# Patient Record
Sex: Female | Born: 1995 | Race: Black or African American | Hispanic: No | Marital: Single | Smoking: Never smoker
Health system: Southern US, Community
[De-identification: ages and names within clinical notes are randomized; demographics above are authoritative.]

---
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
## 2016-11-01 DIAGNOSIS — H1013 Acute atopic conjunctivitis, bilateral: Secondary | ICD-10-CM | POA: Diagnosis not present

## 2016-11-01 DIAGNOSIS — H40033 Anatomical narrow angle, bilateral: Secondary | ICD-10-CM | POA: Diagnosis not present

## 2018-04-08 DIAGNOSIS — S838X2A Sprain of other specified parts of left knee, initial encounter: Secondary | ICD-10-CM | POA: Diagnosis not present

## 2018-05-14 DIAGNOSIS — J029 Acute pharyngitis, unspecified: Secondary | ICD-10-CM | POA: Diagnosis not present

## 2018-05-14 DIAGNOSIS — J069 Acute upper respiratory infection, unspecified: Secondary | ICD-10-CM | POA: Diagnosis not present

## 2018-08-06 ENCOUNTER — Encounter (HOSPITAL_COMMUNITY): Payer: Self-pay | Admitting: Psychiatry

## 2018-08-06 ENCOUNTER — Ambulatory Visit (HOSPITAL_COMMUNITY): Payer: BLUE CROSS/BLUE SHIELD | Admitting: Psychiatry

## 2018-08-06 DIAGNOSIS — F4323 Adjustment disorder with mixed anxiety and depressed mood: Secondary | ICD-10-CM
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
## 2018-08-15 ENCOUNTER — Ambulatory Visit (INDEPENDENT_AMBULATORY_CARE_PROVIDER_SITE_OTHER): Payer: BLUE CROSS/BLUE SHIELD | Admitting: Psychiatry

## 2018-08-15 ENCOUNTER — Encounter (HOSPITAL_COMMUNITY): Payer: Self-pay | Admitting: Psychiatry

## 2018-08-15 DIAGNOSIS — Z202 Contact with and (suspected) exposure to infections with a predominantly sexual mode of transmission: Secondary | ICD-10-CM | POA: Diagnosis not present

## 2018-08-15 DIAGNOSIS — F4323 Adjustment disorder with mixed anxiety and depressed mood: Secondary | ICD-10-CM | POA: Diagnosis not present

## 2018-08-15 NOTE — Progress Notes (Signed)
Client: Silas Flood  Date: 08/15/18  Time: 10:01-10:55 am Type of Therapy: Individual Therapy  Axis I/II Diagnosis: Adjustment Disorder with mixed anxiety and depressed mood  Treatment goals addressed: [name] would like to use therapy time to heal from her past, be able to forgive herself and better understand her anxiety and depression.  Interventions: CBT, Motivational Interviewing, Psychoeducation, Coping Skill Building  Summary: Client [place], 22yo female who presents with Adjustment Disorder, due to recent break up and stage of life issues. Counselor using therapeutic interventions to address grief, loss, triggers and anger and to prepare [name] to focus on self-care, higher education and upcoming move.  Therapist Response: [name] met with Counselor for individual therapy. Counselor joined with D[place] Ar as she shared about relationship dynamics, weekend situation, expression of anger, and future plans. Counselor assessed current psychiatric symptoms and life stressors with [name]. [name] reported that she has felt her anxiety and depression less over the past week. She has not punched any holes in walls and has been mindful about expressing herself with words, versus aggressive physical actions. She reported her ex and her got in one verbally heated argument, but she was able to restrain from physical aggression or property damage. Counselor praised [name] for her self-awareness and self-control and we explored how she achieved this change in behaviors. Counselor shared psychoeducation on grief and loss and setting boundaries. [name] identified that this is an area where she struggles but has made attempts. Counselor gave [name] homework of writing down her wants and needs in a relationship. Counselor and [name] discussed healthy communication and coping skills to prevent future "blow ups" with her ex. Counselor encouraged self-care and [name] was able to identify several ways she could focus on her well-being between now and next  session.  Suicidal/Homicidal: No current safety concerns. No plan/intent to harm self or others.  Plan: To return in 1 week. Will apply skills learned in session at home, until next session.  ?  Lise Auer, LCSW

## 2018-08-22 ENCOUNTER — Ambulatory Visit (INDEPENDENT_AMBULATORY_CARE_PROVIDER_SITE_OTHER): Payer: BLUE CROSS/BLUE SHIELD | Admitting: Psychiatry

## 2018-08-22 ENCOUNTER — Encounter (HOSPITAL_COMMUNITY): Payer: Self-pay | Admitting: Psychiatry

## 2018-08-22 DIAGNOSIS — F4323 Adjustment disorder with mixed anxiety and depressed mood: Secondary | ICD-10-CM | POA: Diagnosis not present

## 2018-08-22 NOTE — Progress Notes (Signed)
Client: Silas Flood  Date: 08/22/18  Time: 9:34-10:31  Type of Therapy: Individual Therapy  Axis I/II Diagnosis: Adjustment Disorder with mixed anxiety and depressed mood  Treatment goals addressed: [name] would like to use therapy time to heal from her past, be able to forgive herself and better understand her anxiety and depression.  Interventions: CBT, Motivational Interviewing, Psychoeducation, Coping Skill Building  Summary: Client [place], 22yo female who presents with Adjustment Disorder, due to recent break up and stage of life issues. Counselor using therapeutic interventions to address grief, loss, triggers and anger and to prepare [name] to focus on self-care, higher education and upcoming move.  Therapist Response: [name] met with Counselor for individual therapy. Counselor joined with Denton Ar as she shared about her recent trip with friends, status of break up, living arrangements, school stressors and grief/loss issues. Counselor assessed current psychiatric symptoms and life stressors with [name]. [name] report no angry outbursts over the past week, despite having emotional triggers. She reports having frequent moments of sadness thinking about memories with her ex. Counselor processed implementation of healthy coping skills, development of healthy support system, and new routine building. Counselor praised [name] for advocating for her needs, expressing her emotions in healthy ways, and communicating appropriately with friends and family. Counselor provided psychoeducation on manifestations of anxiety, positive affirmations, and deep breathing exercises. [name] practices coping skills and shared ways she felt she could implement them over the next week.  Suicidal/Homicidal: No current safety concerns. No plan/intent to harm self or others.  Plan: To return in 1 week. Will apply skills learned in session at home, until next session.  ?  Lise Auer, LCSW

## 2018-08-29 ENCOUNTER — Ambulatory Visit (INDEPENDENT_AMBULATORY_CARE_PROVIDER_SITE_OTHER): Payer: BLUE CROSS/BLUE SHIELD | Admitting: Psychiatry

## 2018-08-29 DIAGNOSIS — F4323 Adjustment disorder with mixed anxiety and depressed mood: Secondary | ICD-10-CM | POA: Diagnosis not present

## 2018-08-31 ENCOUNTER — Encounter (HOSPITAL_COMMUNITY): Payer: Self-pay | Admitting: Psychiatry

## 2018-08-31 NOTE — Progress Notes (Signed)
Client: Jody Edwards  Date: 08/29/18  Time: 9:37-10:33  Type of Therapy: Individual Therapy  Axis I/II Diagnosis: Adjustment Disorder with mixed anxiety and depressed mood  Treatment goals addressed: Jody Edwards would like to use therapy time to heal from her past, be able to forgive herself and better understand her anxiety and depression.  Interventions: CBT, Motivational Interviewing, Psychoeducation, Coping Skill Building  Summary: Client Jody Edwards, 22yo female who presents with Adjustment Disorder, due to recent break up and stage of life issues. Counselor using therapeutic interventions to address grief, loss, triggers and anger and to prepare Jody Edwards to focus on self-care, higher education and upcoming move.  Therapist Response: Jody Edwards met with Counselor for individual therapy. Counselor joined with Jody Edwards as she shared that she is grieving the loss of an aunt, is communicating effectively with her ex, has made improvements in school performance and utilization of coping skills. Counselor assessed current psychiatric symptoms and life stressors with Jody Edwards. Jody Edwards reported having an "up and down" week, somedays were great, some days she felt lonely, anxious and cried over things. Counselor assessed which coping skills she used to handle difficult moments. She reported that box breathing, and positive affirmations helped to deescalate and reduce anxiety. Counselor provided psychoeducation on grief and loss cycle, as Jody Edwards shared about suppressing feelings about her aunt's passing in January. Jody Edwards was able to express her sadness in session. Counselor and Jody Edwards processed family history of relationship issues and Counselor gave her homework to talk with a family member about their intimate partner relationships.  Suicidal/Homicidal: No current safety concerns. No plan/intent to harm self or others.  Plan: To return in 1 week. Will apply skills learned in session at home, until next session.  ?  Lise Auer, LCSW

## 2018-09-04 ENCOUNTER — Other Ambulatory Visit: Payer: Self-pay | Admitting: Obstetrics and Gynecology

## 2018-09-04 ENCOUNTER — Other Ambulatory Visit (HOSPITAL_COMMUNITY)
Admission: RE | Admit: 2018-09-04 | Discharge: 2018-09-04 | Disposition: A | Discharge status: Home / Self Care | Payer: BLUE CROSS/BLUE SHIELD | Source: Ambulatory Visit | Attending: Obstetrics and Gynecology | Admitting: Obstetrics and Gynecology

## 2018-09-04 DIAGNOSIS — Z01419 Encounter for gynecological examination (general) (routine) without abnormal findings: Secondary | ICD-10-CM | POA: Insufficient documentation

## 2018-09-05 ENCOUNTER — Ambulatory Visit (HOSPITAL_COMMUNITY): Payer: BLUE CROSS/BLUE SHIELD | Admitting: Psychiatry

## 2018-09-10 LAB — CYTOLOGY - PAP
Chlamydia: NEGATIVE
DIAGNOSIS: UNDETERMINED — AB
HPV: NOT DETECTED
Neisseria Gonorrhea: NEGATIVE

## 2018-09-12 ENCOUNTER — Ambulatory Visit (HOSPITAL_COMMUNITY): Payer: BLUE CROSS/BLUE SHIELD | Admitting: Psychiatry

## 2018-09-12 ENCOUNTER — Other Ambulatory Visit: Payer: Self-pay

## 2018-09-12 ENCOUNTER — Ambulatory Visit (INDEPENDENT_AMBULATORY_CARE_PROVIDER_SITE_OTHER): Payer: BLUE CROSS/BLUE SHIELD | Admitting: Psychiatry

## 2018-09-12 DIAGNOSIS — F4323 Adjustment disorder with mixed anxiety and depressed mood: Secondary | ICD-10-CM

## 2018-09-15 ENCOUNTER — Encounter (HOSPITAL_COMMUNITY): Payer: Self-pay | Admitting: Psychiatry

## 2018-09-15 NOTE — Progress Notes (Signed)
Client: Jody Edwards  Date: 09/12/18  Time: 10:30-11:25 a  Type of Therapy: Individual Therapy  Axis I/II Diagnosis: Adjustment Disorder with mixed anxiety and depressed mood  Treatment goals addressed: Jody Edwards would like to use therapy time to heal from her past, be able to forgive herself and better understand her anxiety and depression.  Interventions: CBT, Motivational Interviewing, Psychoeducation, Coping Skill Building  Summary: Client Jody Edwards, 22yo female who presents with Adjustment Disorder, due to recent break up and stage of life issues. Counselor using therapeutic interventions to address grief, loss, triggers and anger and to prepare Jody Edwards to focus on self-care, higher education and upcoming move.  Therapist Response: Jody Edwards met with Counselor for individual therapy. Counselor joined with Jody Edwards as she Shared about recent travels, sickness, relationship dynamics, emotional reactivity and progress in all areas. Counselor assessed current psychiatric symptoms and life stressors with Jody Edwards. Jody Edwards reported that she has been more teary, and experiencing effects of grief and loss-mainly sadness and some anger. Counselor introduced the idea of getting a medication evaluation to determine if medications would be helpful short term for coping. Jody Edwards is interested and will look into making an appointment. Counselor assessed current relationships with Jody Edwards to discuss ways to better manage anger and sadness. Counselor provided feedback about the cycle of toxic and unhealthy relationships and how it impacts our mood, behaviors and sociability. Jody Edwards was open and frank about her role in relationships not working out and expressed trust issues, which stem from her relationship with her father. Counselor praised Jody Edwards for her insights and encouraged identification of values and needs within a future relationship. Jody Edwards will set aside time to think about these matters over the next week and will attempt to spend more time  alone to process thoughts and feelings.  Suicidal/Homicidal: No current safety concerns. No plan/intent to harm self or others.  Plan: To return in 1 week. Will apply skills learned in session at home, until next session.  ?  Lise Auer, LCSW

## 2018-09-18 ENCOUNTER — Other Ambulatory Visit: Payer: Self-pay

## 2018-09-18 ENCOUNTER — Encounter (HOSPITAL_COMMUNITY): Payer: Self-pay | Admitting: Psychiatry

## 2018-09-18 ENCOUNTER — Ambulatory Visit (HOSPITAL_COMMUNITY): Payer: BLUE CROSS/BLUE SHIELD | Admitting: Psychiatry

## 2018-09-18 VITALS — BP 120/82 | HR 65 | Temp 98.4°F | Resp 16 | Wt 162.6 lb

## 2018-09-18 DIAGNOSIS — F4323 Adjustment disorder with mixed anxiety and depressed mood: Secondary | ICD-10-CM | POA: Insufficient documentation

## 2018-09-18 MED ORDER — ESCITALOPRAM OXALATE 10 MG PO TABS
10.0000 mg | ORAL_TABLET | Freq: Every day | ORAL | 0 refills | Status: DC
Start: 2018-09-18 — End: 2018-10-15

## 2018-09-18 NOTE — Progress Notes (Signed)
Psychiatric Initial Adult Assessment   Patient Identification: Jody Edwards MRN:  [id number] Date of Evaluation:  09/18/2018 Referral Source: Elsie Saas MD Chief Complaint:  Periods of depression Visit Diagnosis:    ICD-10-CM   1. 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 These mood changes have been originally brought on by breakup in 2 year long relationship with female partner. They broke up on New Year and what makes the situation even more difficult is that they still are roommates (have been living together for 7 months). Later in Jul 25, 2022 patient's aunt passed away and this contributed further to her depression. Patient started individual counseling with Hilbert Odor in early February and has had 5 sessions so far. Her sleep improved and is normal now, anxiety subsided, appetite is unchanged. She has never felt hopeless or suicidal. What worries her and eventually triggered this visit is that she continues to have frequent bouts of "feeling depressed" and would like to try an antidepressant at this time. She has never needed or taken any psychiatric medications. She denies having hx of mania, psychosis, alcohol problem drinking or drug abuse.  Patient is single, no children, admittedly bisexual. 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  Associated Signs/Symptoms: Depression Symptoms:  depressed mood, (Hypo) Manic Symptoms:  Irritable Mood, Anxiety Symptoms:  None Psychotic Symptoms:  None PTSD Symptoms: Negative  Past Psychiatric History: see above  Previous Psychotropic Medications: No   Substance Abuse History in the last 12 months:  No.  Consequences of Substance Abuse: Negative  Past Medical History: No past medical history on file. No past surgical history  on file.  Family Psychiatric History: Does not know of any.  Family History: No family history on file.  Social History:   Social History   Socioeconomic History  . Marital status: Single    Spouse name: Not on file  . Number of children: Not on file  . Years of education: Not on file  . Highest education level: Not on file  Occupational History  . Not on file  Social Needs  . Financial resource strain: Not on file  . Food insecurity:    Worry: Not on file    Inability: Not on file  . Transportation needs:    Medical: Not on file    Non-medical: Not on file  Tobacco Use  . Smoking status: Never Smoker  . Smokeless tobacco: Never Used  Substance and Sexual Activity  . Alcohol use: Yes    Alcohol/week: 3.0 standard drinks    Types: 3 Glasses of wine per week  . Drug use: Never  . Sexual activity: Not Currently  Lifestyle  . Physical activity:    Days per week: Not on file    Minutes per session: Not on file  . Stress: Not on file  Relationships  . Social connections:    Talks on phone: Not on file    Gets together: Not on file    Attends religious service: Not on file    Active member of club or organization: Not on file    Attends meetings of clubs or organizations: Not on file    Relationship status: Not on file  Other Topics Concern  . Not on file  Social History Narrative  . Not on  file     Allergies:  No Known Allergies  Metabolic Disorder Labs: No results found for: HGBA1C, MPG No results found for: PROLACTIN No results found for: CHOL, TRIG, HDL, CHOLHDL, VLDL, LDLCALC No results found for: TSH  Therapeutic Level Labs: No results found for: LITHIUM No results found for: CBMZ No results found for: VALPROATE  Current Medications: Current Outpatient Medications  Medication Sig Dispense Refill  . escitalopram (LEXAPRO) 10 MG tablet Take 1 tablet (10 mg total) by mouth daily for 30 days. 30 tablet 0   No current facility-administered medications  for this visit.     Musculoskeletal: Strength & Muscle Tone: within normal limits Gait & Station: normal Patient leans: N/A  Psychiatric Specialty Exam: Review of Systems  Constitutional: Negative.   HENT: Negative.   Eyes: Negative.   Respiratory: Negative.   Cardiovascular: Negative.   Gastrointestinal: Negative.   Genitourinary: Negative.   Musculoskeletal: Negative.   Skin: Negative.   Neurological: Negative.   Endo/Heme/Allergies: Negative.   Psychiatric/Behavioral: Positive for depression.    Blood pressure 120/82, pulse 65, temperature 98.4 F (36.9 C), resp. rate 16, weight 162 lb 9.6 oz (73.8 kg).There is no height or weight on file to calculate BMI.  General Appearance: Casual  Eye Contact:  Good  Speech:  Clear and Coherent  Volume:  Normal  Mood:  Depressed  Affect:  Full Range  Thought Process:  Goal Directed  Orientation:  Full (Time, Place, and Person)  Thought Content:  Logical  Suicidal Thoughts:  Denies  Homicidal Thoughts:  No  Memory:  Immediate;   Good Recent;   Good Remote;   Good  Judgement:  Intact  Insight:  Fair  Psychomotor Activity:  Normal  Concentration:  Concentration: Good  Recall:  Good  Fund of Knowledge:Good  Language: Good  Akathisia:  Negative  Handed:  Right  AIMS (if indicated):  not done  Assets:  Communication Skills Desire for Improvement Housing Physical Health Vocational/Educational  ADL's:  Intact  Cognition: WNL  Sleep:  Good    Assessment and Plan: 23 yo single female with no psychiatric hx presents with some depression which has been present for the third month and does not seem to be improving mkuch. Anxiety and irritability subsided.ite is good. Her mood symptoms started after breakup of 2 year lomng relationship and intensified (depression ) after loss of aunt f soon afterwords. She is counseling with Hilbert Odor (5 sessions so far) and is making progress there. She is interested in a trial af an  ntidepressant despite relatively mild symptoms. I have explained what she expect from a trial of an SSRI revieweing both potential benefits as well as adverse effects. We decided to try a low-medium dose of escitalopram. The only other medciation she takes is naprosyn for menstrual cramps.   Dx: Adjustment disorder with mixed emotions  Plan: Escitalopram 5 mg daily x 6 days then 10 mg daily. Continue individual counseling as per previous plan. Return to clinic in one month. We anticipate that she will come off escitalopram within next 6 months providing there are no new stressors present at that time. The plan was discussed with patient. I spend 55 minutes in direct face to face clinical contact with the patient and devoted approximately 50% of this time to explanation of diagnosis, discussion of treatment options and med education.   Magdalene Patricia, MD 3/19/202010:44 AM

## 2018-09-19 ENCOUNTER — Ambulatory Visit (HOSPITAL_COMMUNITY): Payer: BLUE CROSS/BLUE SHIELD | Admitting: Psychiatry

## 2018-09-26 ENCOUNTER — Ambulatory Visit (HOSPITAL_COMMUNITY): Payer: BLUE CROSS/BLUE SHIELD | Admitting: Psychiatry

## 2018-10-03 ENCOUNTER — Ambulatory Visit (INDEPENDENT_AMBULATORY_CARE_PROVIDER_SITE_OTHER): Payer: BLUE CROSS/BLUE SHIELD | Admitting: Psychiatry

## 2018-10-03 ENCOUNTER — Encounter (HOSPITAL_COMMUNITY): Payer: Self-pay | Admitting: Psychiatry

## 2018-10-03 ENCOUNTER — Other Ambulatory Visit: Payer: Self-pay

## 2018-10-03 DIAGNOSIS — F4323 Adjustment disorder with mixed anxiety and depressed mood: Secondary | ICD-10-CM | POA: Diagnosis not present

## 2018-10-03 NOTE — Progress Notes (Signed)
Virtual Visit via Video Note  I connected with [name] on 10/03/18 at 11:30 AM EDT by a video enabled telemedicine application and verified that I am speaking with the correct person using two identifiers.   I discussed the limitations of evaluation and management by telemedicine and the availability of in person appointments. The patient expressed understanding and agreed to proceed.  History of Present Illness: Adjustment disorder with mixed anxiety and depressed mood due to recent break up and stage of life issues.    Observations/Objective: Counselor met with [name] via Webex for individual therapy. Counselor checked in to assess psychiatric symptoms and life stressors influenced by COVID-19 restrictions. [name] presented as very calm, happy, optimistic and centered. [name] reported on several positive aspects of her progress on treatment goals. Counselor praised her for utilizing healthy coping skills, setting boundaries and communicating her needs with others. [name] reported how she is implementing coping strategies and decreasing her own stress levels. Counselor provided psychoeducation on parent child relationships through the life cycle related to her concerns about her relationship with her father. Counselor reiterated continuing self-care routine and staying safe.   Assessment and Plan: [name] would like to move to meeting EOW due to progress made in therapy.  Follow Up Instructions: Counselor will send link for next session.   I discussed the assessment and treatment plan with the patient. The patient was provided an opportunity to ask questions and all were answered. The patient agreed with the plan and demonstrated an understanding of the instructions.   The patient was advised to call back or seek an in-person evaluation if the symptoms worsen or if the condition fails to improve as anticipated.  I provided 54 minutes of non-face-to-face time during this encounter.   Lise Auer,  LCSW

## 2018-10-15 ENCOUNTER — Telehealth (HOSPITAL_COMMUNITY): Payer: Self-pay

## 2018-10-15 ENCOUNTER — Other Ambulatory Visit (HOSPITAL_COMMUNITY): Payer: Self-pay | Admitting: Psychiatry

## 2018-10-15 MED ORDER — ESCITALOPRAM OXALATE 10 MG PO TABS: 10.0000 mg | ORAL_TABLET | Freq: Every day | ORAL | 0 refills | Status: AC

## 2018-10-15 NOTE — Telephone Encounter (Signed)
I did send  A 30 day refill this time but she really needs to make a follow up appointment if she wants to keep getting refills from now on.  OP

## 2018-10-15 NOTE — Telephone Encounter (Signed)
Medication refill request - Fax refill request for patient's started Escitalopram.  Patient started on medication 09/18/18 with plan to return in 1 month but never made another MD appt.

## 2018-10-16 NOTE — Telephone Encounter (Signed)
Medication management - Message left for patient Dr. Hinton Dyer had sent in a new 30 day order for her Lexapro but that she would need to call back to set up an appt within the next 30 days for any further refills. Requested patient call our office back as soon as possible to schedule a virtual video or phone visit within the next 30 days with Dr. Hinton Dyer.

## 2018-10-17 ENCOUNTER — Other Ambulatory Visit: Payer: Self-pay

## 2018-10-17 ENCOUNTER — Ambulatory Visit (HOSPITAL_COMMUNITY): Payer: BLUE CROSS/BLUE SHIELD | Admitting: Psychiatry

## 2018-10-23 ENCOUNTER — Encounter (HOSPITAL_COMMUNITY): Payer: Self-pay | Admitting: Psychiatry

## 2018-10-23 ENCOUNTER — Other Ambulatory Visit: Payer: Self-pay

## 2018-10-23 ENCOUNTER — Ambulatory Visit (INDEPENDENT_AMBULATORY_CARE_PROVIDER_SITE_OTHER): Payer: BLUE CROSS/BLUE SHIELD | Admitting: Psychiatry

## 2018-10-23 DIAGNOSIS — F4323 Adjustment disorder with mixed anxiety and depressed mood: Secondary | ICD-10-CM

## 2018-10-23 NOTE — Progress Notes (Signed)
Virtual Visit via Video Note  I connected with Lorel A Heng on 10/23/18 at 10:00 AM EDT by a video enabled telemedicine application and verified that I am speaking with the correct person using two identifiers.   I discussed the limitations of evaluation and management by telemedicine and the availability of in person appointments. The patient expressed understanding and agreed to proceed.  History of Present Illness: Adjustment disorder with mixed anxiety and depressed mood due to stage of life issues, relational issues and grief and loss.    Observations/Objective: Counselor met with [name] via Webex for individual therapy. Counselor assessed mental health symptoms. [name] reports that overall her mood has stabilized and that more than anything she is experiencing boredom which has caused her to drink alcohol more. Counselor explored motivation for drinking and promoted healthier coping skills. Counselor processed thoughts and feelings regarding loss of job, ending school semester and moving apartments. [name] is choosing to be proactive and has a plan for addressing those stressors. [name] reports making healthy choices in her personal relationships and improved communication and anger management skills. Counselor praised her for her progress in these areas. Counselor and [name] reviewed plan and healthy coping skills and wrapped up the session.   Assessment and Plan: Counselor and [name] will continue to meet to address treatment plan goals. [name] will complete tasks identified in session to reduce her stress levels.    Follow Up Instructions: Counselor will set up additional appointments and will send Webex link for next session.    I discussed the assessment and treatment plan with the patient. The patient was provided an opportunity to ask questions and all were answered. The patient agreed with the plan and demonstrated an understanding of the instructions.   The patient was advised to call back or seek  an in-person evaluation if the symptoms worsen or if the condition fails to improve as anticipated.  I provided 43 minutes of non-face-to-face time during this encounter.   Bethany Morris, LCSW  

## 2018-10-27 DIAGNOSIS — Z0000 Encounter for general adult medical examination without abnormal findings: Secondary | ICD-10-CM | POA: Diagnosis not present

## 2018-10-31 ENCOUNTER — Ambulatory Visit (HOSPITAL_COMMUNITY): Payer: BLUE CROSS/BLUE SHIELD | Admitting: Psychiatry

## 2018-11-06 ENCOUNTER — Other Ambulatory Visit: Payer: Self-pay

## 2018-11-06 ENCOUNTER — Ambulatory Visit (INDEPENDENT_AMBULATORY_CARE_PROVIDER_SITE_OTHER): Payer: BLUE CROSS/BLUE SHIELD | Admitting: Psychiatry

## 2018-11-06 DIAGNOSIS — F4323 Adjustment disorder with mixed anxiety and depressed mood: Secondary | ICD-10-CM

## 2018-11-07 ENCOUNTER — Encounter (HOSPITAL_COMMUNITY): Payer: Self-pay | Admitting: Psychiatry

## 2018-11-07 NOTE — Progress Notes (Signed)
Virtual Visit via Video Note  I connected with Jody Edwards on 11/07/18 at  2:30 PM EDT by a video enabled telemedicine application and verified that I am speaking with the correct person using two identifiers.  Location: Patient: Jody Edwards  Provider: Lise Auer, LCSW   I discussed the limitations of evaluation and management by telemedicine and the availability of in person appointments. The patient expressed understanding and agreed to proceed.  History of Present Illness: Adjustment disorder with mixed anxiety and depressed mood due to stage of life issues, financial and housing concerns.    Observations/Objective: Counselor met with Jody Edwards for individual therapy via Webex. Counselor assessed MH symptoms and progress on treatment plan goals. Jody Edwards shared that she has been experiencing more anxiety and depression because she is currently unemployed, not receiving income, her housing will be ending on June 1st, she  and she is having relationship issues. Counselor used CBT interventions to help her process these stressors. We reviewed helpful coping skills to address the issues. Jody Edwards reported that she's been drinking more frequently to cope. Counselor assessed usage and impact. Counselor praised Jody Edwards for the progress she has made and how successful this semester has been for her overall. Counselor and Jody Edwards discussed future goals and upcoming projects. Jody Edwards denied suicidal ideation or self-harm.  Assessment and Plan: Counselor will continue to meet with Jody Edwards to address treatment plan goals. Jody Edwards will continue to follow recommendations of providers and implement skills learned in session.  Follow Up Instructions: Counselor will send information for next session via Webex.   I discussed the assessment and treatment plan with the patient. The patient was provided an opportunity to ask questions and all were answered. The patient agreed with the plan and demonstrated an understanding of the  instructions.   The patient was advised to call back or seek an in-person evaluation if the symptoms worsen or if the condition fails to improve as anticipated.  I provided 55 minutes of non-face-to-face time during this encounter.   Lise Auer, LCSW

## 2018-11-20 ENCOUNTER — Ambulatory Visit (HOSPITAL_COMMUNITY): Payer: BLUE CROSS/BLUE SHIELD | Admitting: Psychiatry

## 2018-11-20 ENCOUNTER — Other Ambulatory Visit: Payer: Self-pay

## 2018-12-04 ENCOUNTER — Other Ambulatory Visit: Payer: Self-pay

## 2018-12-04 ENCOUNTER — Ambulatory Visit (HOSPITAL_COMMUNITY): Payer: BLUE CROSS/BLUE SHIELD | Admitting: Psychiatry

## 2018-12-04 ENCOUNTER — Ambulatory Visit (INDEPENDENT_AMBULATORY_CARE_PROVIDER_SITE_OTHER): Payer: BC Managed Care – PPO | Admitting: Psychiatry

## 2018-12-04 DIAGNOSIS — F4323 Adjustment disorder with mixed anxiety and depressed mood: Secondary | ICD-10-CM

## 2018-12-05 ENCOUNTER — Encounter (HOSPITAL_COMMUNITY): Payer: Self-pay | Admitting: Psychiatry

## 2018-12-05 NOTE — Progress Notes (Signed)
Virtual Visit via Video Note  I connected with Jody Edwards on 12/05/18 at  2:00 PM EDT by a video enabled telemedicine application and verified that I am speaking with the correct person using two identifiers.  Location: Patient: Jody Edwards Provider: Lise Auer, LCSW   I discussed the limitations of evaluation and management by telemedicine and the availability of in person appointments. The patient expressed understanding and agreed to proceed.  History of Present Illness: Adjustment Disorder with mixed anxiety and depressed mood due to stage of life issues, unemployment and grief and loss issues.    Observations/Objective: Counselor met with Jody Edwards for individual therapy via Webex. Counselor assessed MH symptoms and progress on treatment plan goals. Jody Edwards denied suicidal ideation or self-harm behaviors. Jody Edwards shared that she was experiencing increased depression and anxiety. Jody Edwards shared that she has been making attempts to share her feelings and concerns within her support system, but doesn't feel heard or understood, which causes her to act out in anger and frustration. Counselor explored these feelings and interactions within her relationships. Counselor reviewed healthy coping skills and communication skills. Counselor used CBT interventions to help Jody Edwards processed thoughts, emotions and actions to better understand how they are all related. Counselor used Solution-Focused interventions to problem solve areas of concern for Jody Edwards. Counselor prompted Jody Edwards to shared positive aspects of life, statements of gratitude and positive mantras she can use to override the unhelpful automatic thoughts. Jody Edwards responded well to therapy and thanked Counselor for support and space to work on these issues.    Assessment and Plan: Counselor will continue to meet with Jody Edwards to address treatment plan goals. Jody Edwards will continue to follow recommendations of providers and implement  skills learned in session.  Follow Up Instructions: Counselor will send information for next session via Webex.     I discussed the assessment and treatment plan with the patient. The patient was provided an opportunity to ask questions and all were answered. The patient agreed with the plan and demonstrated an understanding of the instructions.   The patient was advised to call back or seek an in-person evaluation if the symptoms worsen or if the condition fails to improve as anticipated.  I provided 55 minutes of non-face-to-face time during this encounter.   Lise Auer, LCSW

## 2018-12-18 ENCOUNTER — Ambulatory Visit (INDEPENDENT_AMBULATORY_CARE_PROVIDER_SITE_OTHER): Payer: BC Managed Care – PPO | Admitting: Psychiatry

## 2018-12-18 ENCOUNTER — Other Ambulatory Visit: Payer: Self-pay

## 2018-12-18 DIAGNOSIS — F4323 Adjustment disorder with mixed anxiety and depressed mood: Secondary | ICD-10-CM | POA: Diagnosis not present

## 2018-12-19 ENCOUNTER — Encounter (HOSPITAL_COMMUNITY): Payer: Self-pay | Admitting: Psychiatry

## 2018-12-19 NOTE — Progress Notes (Signed)
Virtual Visit via Video Note  I connected with Jody Edwards on 12/19/18 at  3:00 PM EDT by a video enabled telemedicine application and verified that I am speaking with the correct person using two identifiers.  Location: Patient: Jody Edwards Provider: Lise Auer, LCSW   I discussed the limitations of evaluation and management by telemedicine and the availability of in person appointments. The patient expressed understanding and agreed to proceed.  History of Present Illness:    Observations/Objective: Counselor met with Jody Edwards for individual therapy via Webex. Counselor assessed MH symptoms and progress on treatment plan goals. Jody Edwards denied suicidal ideation or self-harm behaviors. Jody Edwards shared that that she recently experienced her first panic attack in over 3 years, due to a conflict between her and her mother. Her partner and mom were able to help her recover and stop the panic attack. It took her 2 days to feel normal again. Counselor processed emotions and shared DBT TIPP Skills for recovering from Panic attacks. We addressed the life stressors that have been impacting her mental health. Counselor prompted Jody Edwards to use CBT skills in processing thoughts and feelings in relation. Counselor and Jody Edwards discussed progress in other areas and reviewed communication skills to use with people within her support system.   Assessment and Plan: Counselor will continue to meet with Jody Edwards to address treatment plan goals. Jody Edwards will continue to follow recommendations of providers and implement skills learned in session.  Follow Up Instructions: Counselor will send information for next session via Webex.    I discussed the assessment and treatment plan with the patient. The patient was provided an opportunity to ask questions and all were answered. The patient agreed with the plan and demonstrated an understanding of the instructions.   The patient was advised to call back or seek an in-person evaluation if  the symptoms worsen or if the condition fails to improve as anticipated.  I provided 55 minutes of non-face-to-face time during this encounter.   Lise Auer, LCSW

## 2019-01-08 ENCOUNTER — Encounter (HOSPITAL_COMMUNITY): Payer: Self-pay | Admitting: Psychiatry

## 2019-01-08 ENCOUNTER — Other Ambulatory Visit: Payer: Self-pay

## 2019-01-08 ENCOUNTER — Ambulatory Visit (INDEPENDENT_AMBULATORY_CARE_PROVIDER_SITE_OTHER): Payer: BC Managed Care – PPO | Admitting: Psychiatry

## 2019-01-08 DIAGNOSIS — F4323 Adjustment disorder with mixed anxiety and depressed mood: Secondary | ICD-10-CM

## 2019-01-08 NOTE — Progress Notes (Signed)
Virtual Visit via Video Note  I connected with Jody Edwards on 01/08/19 at  4:00 PM EDT by a video enabled telemedicine application and verified that I am speaking with the correct person using two identifiers.  Location: Patient: Jody Edwards Provider: Lise Auer, LCSW   I discussed the limitations of evaluation and management by telemedicine and the availability of in person appointments. The patient expressed understanding and agreed to proceed.  History of Present Illness: Adjustment Disorder with mixed conduct and mood   Observations/Objective: Counselor met with Jody Edwards for individual therapy via Webex. Counselor assessed MH symptoms and progress on treatment plan goals. Jody Edwards denied suicidal ideation or self-harm behaviors. Jody Edwards shared that her symptoms have improved with less frequent episodes of moderate depression and anxiety. She reports having no panic attacks since May. Counselor processed coping skills as she transitions in housing, income, relationships and with family dynamics. Counselor praised Jody Edwards for her application of coping skills, cognitive coping and communicating her needs with her support system. Counselor discussed changes in frequency of session. Counselor processed safety and well-being in her returning to school for preventative measures. Jody Edwards reports feeling much better overall.   Assessment and Plan: Counselor will continue to meet with Jody Edwards to address treatment plan goals. Jody Edwards will continue to follow recommendations of providers and implement skills learned in session.  Follow Up Instructions: Counselor will send information for next session via Webex.     I discussed the assessment and treatment plan with the patient. The patient was provided an opportunity to ask questions and all were answered. The patient agreed with the plan and demonstrated an understanding of the instructions.   The patient was advised to call back or seek an in-person evaluation if the  symptoms worsen or if the condition fails to improve as anticipated.  I provided 50 minutes of non-face-to-face time during this encounter.   Lise Auer, LCSW

## 2019-02-17 ENCOUNTER — Other Ambulatory Visit: Payer: Self-pay

## 2019-02-17 ENCOUNTER — Ambulatory Visit (INDEPENDENT_AMBULATORY_CARE_PROVIDER_SITE_OTHER): Payer: BC Managed Care – PPO | Admitting: Psychiatry

## 2019-02-17 ENCOUNTER — Encounter (HOSPITAL_COMMUNITY): Payer: Self-pay | Admitting: Psychiatry

## 2019-02-17 DIAGNOSIS — F4323 Adjustment disorder with mixed anxiety and depressed mood: Secondary | ICD-10-CM | POA: Diagnosis not present

## 2019-02-17 NOTE — Progress Notes (Signed)
Virtual Visit via Video Note  I connected with Jody Edwards on 02/17/19 at  3:00 PM EDT by a video enabled telemedicine application and verified that I am speaking with the correct person using two identifiers.  Location: Patient: Jody Edwards  Provider: Bethany Morris, LCSW   I discussed the limitations of evaluation and management by telemedicine and the availability of in person appointments. The patient expressed understanding and agreed to proceed.  History of Present Illness: Adjustment DO with mixed anxiety and depressed mood   Observations/Objective: Counselor met with Jody Edwards for individual therapy via Webex. Counselor assessed MH symptoms and progress on treatment plan goals. Jody Edwards denied suicidal ideation or self-harm behaviors. Jody Edwards shared that over the past month she has been on an "emotional roller coaster". Counselor explored life stressors and circumstances around her emotional turmoil. Counselor used CBT interventions to process thoughts and emotions. Jody Edwards was able to identify how she is using healthy coping and communication skills to address conflicts in her personal life and relationships. Jody Edwards reported making major life decisions over the past month and feeling good about her decisions. Counselor celebrated her successes and reviewed past coping strategies in addressing her problems.   Assessment and Plan: Counselor will continue to meet with patient to address treatment plan goals. Patient will continue to follow recommendations of providers and implement skills learned in session.  Follow Up Instructions: Counselor will send information for next session via Webex.     I discussed the assessment and treatment plan with the patient. The patient was provided an opportunity to ask questions and all were answered. The patient agreed with the plan and demonstrated an understanding of the instructions.   The patient was advised to call back or seek an in-person evaluation if  the symptoms worsen or if the condition fails to improve as anticipated.  I provided 55 minutes of non-face-to-face time during this encounter.   Bethany Morris, LCSW  

## 2019-04-06 ENCOUNTER — Ambulatory Visit (HOSPITAL_COMMUNITY): Payer: BC Managed Care – PPO | Admitting: Psychiatry

## 2019-04-06 ENCOUNTER — Other Ambulatory Visit: Payer: Self-pay

## 2019-04-16 ENCOUNTER — Ambulatory Visit (HOSPITAL_COMMUNITY): Payer: BC Managed Care – PPO | Admitting: Psychiatry

## 2019-05-06 DIAGNOSIS — J029 Acute pharyngitis, unspecified: Secondary | ICD-10-CM | POA: Diagnosis not present

## 2019-05-06 DIAGNOSIS — Z20828 Contact with and (suspected) exposure to other viral communicable diseases: Secondary | ICD-10-CM | POA: Diagnosis not present

## 2019-05-06 DIAGNOSIS — J0100 Acute maxillary sinusitis, unspecified: Secondary | ICD-10-CM | POA: Diagnosis not present

## 2019-05-06 DIAGNOSIS — J3489 Other specified disorders of nose and nasal sinuses: Secondary | ICD-10-CM | POA: Diagnosis not present

## 2019-06-04 DIAGNOSIS — F411 Generalized anxiety disorder: Secondary | ICD-10-CM | POA: Diagnosis not present

## 2019-06-04 DIAGNOSIS — R87619 Unspecified abnormal cytological findings in specimens from cervix uteri: Secondary | ICD-10-CM | POA: Diagnosis not present

## 2019-06-04 DIAGNOSIS — L700 Acne vulgaris: Secondary | ICD-10-CM | POA: Diagnosis not present

## 2019-06-04 DIAGNOSIS — N946 Dysmenorrhea, unspecified: Secondary | ICD-10-CM | POA: Diagnosis not present

## 2019-06-16 DIAGNOSIS — U071 COVID-19: Secondary | ICD-10-CM | POA: Diagnosis not present

## 2019-06-16 DIAGNOSIS — Z20828 Contact with and (suspected) exposure to other viral communicable diseases: Secondary | ICD-10-CM | POA: Diagnosis not present

## 2019-08-01 ENCOUNTER — Emergency Department (HOSPITAL_COMMUNITY)
Admission: EM | Admit: 2019-08-01 | Discharge: 2019-08-01 | Disposition: A | Discharge status: Home / Self Care | Payer: No Typology Code available for payment source | Attending: Emergency Medicine | Admitting: Emergency Medicine

## 2019-08-01 ENCOUNTER — Emergency Department (HOSPITAL_COMMUNITY): Payer: No Typology Code available for payment source

## 2019-08-01 ENCOUNTER — Other Ambulatory Visit: Payer: Self-pay

## 2019-08-01 ENCOUNTER — Encounter (HOSPITAL_COMMUNITY): Payer: Self-pay | Admitting: *Deleted

## 2019-08-01 DIAGNOSIS — Y999 Unspecified external cause status: Secondary | ICD-10-CM | POA: Diagnosis not present

## 2019-08-01 DIAGNOSIS — Y92410 Unspecified street and highway as the place of occurrence of the external cause: Secondary | ICD-10-CM | POA: Insufficient documentation

## 2019-08-01 DIAGNOSIS — S3992XA Unspecified injury of lower back, initial encounter: Secondary | ICD-10-CM | POA: Diagnosis present

## 2019-08-01 DIAGNOSIS — M542 Cervicalgia: Secondary | ICD-10-CM | POA: Diagnosis not present

## 2019-08-01 DIAGNOSIS — M436 Torticollis: Secondary | ICD-10-CM

## 2019-08-01 DIAGNOSIS — S39012A Strain of muscle, fascia and tendon of lower back, initial encounter: Secondary | ICD-10-CM | POA: Insufficient documentation

## 2019-08-01 DIAGNOSIS — Y939 Activity, unspecified: Secondary | ICD-10-CM | POA: Insufficient documentation

## 2019-08-01 DIAGNOSIS — R519 Headache, unspecified: Secondary | ICD-10-CM | POA: Diagnosis not present

## 2019-08-01 LAB — POC URINE PREG, ED: Preg Test, Ur: NEGATIVE
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
## 2019-08-01 MED ORDER — METHOCARBAMOL 500 MG PO TABS: 500.0000 mg | ORAL_TABLET | Freq: Three times a day (TID) | ORAL | 0 refills | Status: AC | PRN

## 2019-08-01 NOTE — ED Notes (Signed)
Pt d/c home per MD order. Discharge summary reviewed, pt verbalizes understanding. Pt ambulatory off unit. No s/s of acute distress noted.
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
## 2019-08-01 NOTE — ED Notes (Signed)
Patient transported to X-ray 

## 2019-08-01 NOTE — Discharge Instructions (Addendum)
Please take Naproxen (aleve) and Tylenol (acetaminophen) to relieve your pain.  You may take two pills of aleve every 12 hours.  After 2-3 days please try to only take one pill every 12 hours.  In between doses of aleve you may take tylenol, up to 1,000 mg (two extra strength pills).  Do not take more than 3,000 mg tylenol in a 24 hour period.  Please check all medication labels as many medications such as pain and cold medications may contain tylenol.  Do not drink alcohol while taking these medications.  Do not take other NSAID'S while taking aleve (naproxen) (such as motrin, ibuprofen, voltaren or advil).  Please take aleve (naproxen) with food to decrease stomach upset.  If the aleve irritates your stomach you may stop and get voltaren gel.  This is over the counter and follow directions on the box.   You are being prescribed a medication which may make you sleepy. For 24 hours after one dose please do not drive, operate heavy machinery, care for a small child with out another adult present, or perform any activities that may cause harm to you or someone else if you were to fall asleep or be impaired.   

## 2019-09-07 DIAGNOSIS — Z20822 Contact with and (suspected) exposure to covid-19: Secondary | ICD-10-CM | POA: Diagnosis not present

## 2019-09-07 DIAGNOSIS — R197 Diarrhea, unspecified: Secondary | ICD-10-CM | POA: Diagnosis not present

## 2019-09-07 DIAGNOSIS — R110 Nausea: Secondary | ICD-10-CM | POA: Diagnosis not present

## 2020-01-02 ENCOUNTER — Inpatient Hospital Stay (HOSPITAL_COMMUNITY)
Admission: EM | Admit: 2020-01-02 | Discharge: 2020-01-05 | DRG: 208 | Disposition: A | Discharge status: Home / Self Care | Payer: BLUE CROSS/BLUE SHIELD | Attending: General Surgery | Admitting: General Surgery

## 2020-01-02 ENCOUNTER — Inpatient Hospital Stay (HOSPITAL_COMMUNITY): Payer: BLUE CROSS/BLUE SHIELD

## 2020-01-02 ENCOUNTER — Emergency Department (HOSPITAL_COMMUNITY): Payer: BLUE CROSS/BLUE SHIELD

## 2020-01-02 ENCOUNTER — Encounter (HOSPITAL_COMMUNITY): Payer: Self-pay | Admitting: Emergency Medicine

## 2020-01-02 DIAGNOSIS — R404 Transient alteration of awareness: Secondary | ICD-10-CM | POA: Diagnosis not present

## 2020-01-02 DIAGNOSIS — S90511A Abrasion, right ankle, initial encounter: Secondary | ICD-10-CM | POA: Diagnosis present

## 2020-01-02 DIAGNOSIS — S2242XA Multiple fractures of ribs, left side, initial encounter for closed fracture: Secondary | ICD-10-CM | POA: Diagnosis not present

## 2020-01-02 DIAGNOSIS — S129XXA Fracture of neck, unspecified, initial encounter: Secondary | ICD-10-CM | POA: Diagnosis present

## 2020-01-02 DIAGNOSIS — F449 Dissociative and conversion disorder, unspecified: Secondary | ICD-10-CM | POA: Diagnosis not present

## 2020-01-02 DIAGNOSIS — S22020A Wedge compression fracture of second thoracic vertebra, initial encounter for closed fracture: Secondary | ICD-10-CM | POA: Diagnosis not present

## 2020-01-02 DIAGNOSIS — S60512A Abrasion of left hand, initial encounter: Secondary | ICD-10-CM | POA: Diagnosis not present

## 2020-01-02 DIAGNOSIS — S022XXA Fracture of nasal bones, initial encounter for closed fracture: Secondary | ICD-10-CM | POA: Diagnosis present

## 2020-01-02 DIAGNOSIS — S32028A Other fracture of second lumbar vertebra, initial encounter for closed fracture: Secondary | ICD-10-CM | POA: Diagnosis not present

## 2020-01-02 DIAGNOSIS — S32038A Other fracture of third lumbar vertebra, initial encounter for closed fracture: Secondary | ICD-10-CM | POA: Diagnosis not present

## 2020-01-02 DIAGNOSIS — S270XXA Traumatic pneumothorax, initial encounter: Secondary | ICD-10-CM | POA: Diagnosis not present

## 2020-01-02 DIAGNOSIS — S32048A Other fracture of fourth lumbar vertebra, initial encounter for closed fracture: Secondary | ICD-10-CM | POA: Diagnosis present

## 2020-01-02 DIAGNOSIS — S12491A Other nondisplaced fracture of fifth cervical vertebra, initial encounter for closed fracture: Secondary | ICD-10-CM | POA: Diagnosis present

## 2020-01-02 DIAGNOSIS — S0081XA Abrasion of other part of head, initial encounter: Secondary | ICD-10-CM | POA: Diagnosis not present

## 2020-01-02 DIAGNOSIS — R200 Anesthesia of skin: Secondary | ICD-10-CM | POA: Diagnosis present

## 2020-01-02 DIAGNOSIS — T1490XA Injury, unspecified, initial encounter: Secondary | ICD-10-CM

## 2020-01-02 DIAGNOSIS — S025XXA Fracture of tooth (traumatic), initial encounter for closed fracture: Secondary | ICD-10-CM | POA: Diagnosis present

## 2020-01-02 DIAGNOSIS — Z20822 Contact with and (suspected) exposure to covid-19: Secondary | ICD-10-CM | POA: Diagnosis not present

## 2020-01-02 DIAGNOSIS — J9601 Acute respiratory failure with hypoxia: Secondary | ICD-10-CM | POA: Diagnosis not present

## 2020-01-02 DIAGNOSIS — S22058A Other fracture of T5-T6 vertebra, initial encounter for closed fracture: Secondary | ICD-10-CM | POA: Diagnosis present

## 2020-01-02 DIAGNOSIS — R531 Weakness: Secondary | ICD-10-CM | POA: Diagnosis present

## 2020-01-02 DIAGNOSIS — R402431 Glasgow coma scale score 3-8, in the field [EMT or ambulance]: Secondary | ICD-10-CM | POA: Diagnosis not present

## 2020-01-02 DIAGNOSIS — Z23 Encounter for immunization: Secondary | ICD-10-CM

## 2020-01-02 DIAGNOSIS — E876 Hypokalemia: Secondary | ICD-10-CM

## 2020-01-02 DIAGNOSIS — R0689 Other abnormalities of breathing: Secondary | ICD-10-CM | POA: Diagnosis not present

## 2020-01-02 DIAGNOSIS — J939 Pneumothorax, unspecified: Secondary | ICD-10-CM

## 2020-01-02 DIAGNOSIS — R092 Respiratory arrest: Secondary | ICD-10-CM

## 2020-01-02 LAB — COMPREHENSIVE METABOLIC PANEL
ALT: 75 U/L — ABNORMAL HIGH (ref 0–44)
AST: 122 U/L — ABNORMAL HIGH (ref 15–41)
Albumin: 3.9 g/dL (ref 3.5–5.0)
Alkaline Phosphatase: 49 U/L (ref 38–126)
Anion gap: 15 (ref 5–15)
BUN: 11 mg/dL (ref 6–20)
CO2: 16 mmol/L — ABNORMAL LOW (ref 22–32)
Calcium: 9 mg/dL (ref 8.9–10.3)
Chloride: 110 mmol/L (ref 98–111)
Creatinine, Ser: 1.04 mg/dL — ABNORMAL HIGH (ref 0.44–1.00)
GFR calc Af Amer: 37 mL/min — ABNORMAL LOW (ref >60)
GFR calc non Af Amer: 32 mL/min — ABNORMAL LOW (ref >60)
Glucose, Bld: 112 mg/dL — ABNORMAL HIGH (ref 70–99)
Potassium: 3.1 mmol/L — ABNORMAL LOW (ref 3.5–5.1)
Sodium: 141 mmol/L (ref 135–145)
Total Bilirubin: 0.6 mg/dL (ref 0.3–1.2)
Total Protein: 6.7 g/dL (ref 6.5–8.1)

## 2020-01-02 LAB — POCT I-STAT 7, (LYTES, BLD GAS, ICA,H+H)
Acid-base deficit: 7 mmol/L — ABNORMAL HIGH (ref 0.0–2.0)
Bicarbonate: 20.8 mmol/L (ref 20.0–28.0)
Calcium, Ion: 1.12 mmol/L — ABNORMAL LOW (ref 1.15–1.40)
HCT: 38 % (ref 36.0–46.0)
Hemoglobin: 12.9 g/dL (ref 12.0–15.0)
O2 Saturation: 100 %
Patient temperature: 98
Potassium: 3.8 mmol/L (ref 3.5–5.1)
Sodium: 141 mmol/L (ref 135–145)
TCO2: 22 mmol/L (ref 22–32)
pCO2 arterial: 50 mmHg — ABNORMAL HIGH (ref 32.0–48.0)
pH, Arterial: 7.225 — ABNORMAL LOW (ref 7.350–7.450)
pO2, Arterial: 532 mmHg — ABNORMAL HIGH (ref 83.0–108.0)

## 2020-01-02 LAB — BPAM FFP
Blood Product Expiration Date: 202107082359
Blood Product Expiration Date: 202107082359
Blood Product Expiration Date: 202107162359
Blood Product Expiration Date: 202107202359
ISSUE DATE / TIME: 202107030307
ISSUE DATE / TIME: 202107030307
ISSUE DATE / TIME: 202107030413
ISSUE DATE / TIME: 202107030413
Unit Type and Rh: 6200
Unit Type and Rh: 6200
Unit Type and Rh: 6200
Unit Type and Rh: 6200

## 2020-01-02 LAB — CBC
HCT: 42 % (ref 36.0–46.0)
HCT: 38.1 % (ref 36.0–46.0)
Hemoglobin: 12.8 g/dL (ref 12.0–15.0)
Hemoglobin: 11.9 g/dL — ABNORMAL LOW (ref 12.0–15.0)
MCH: 25.5 pg — ABNORMAL LOW (ref 26.0–34.0)
MCH: 25.8 pg — ABNORMAL LOW (ref 26.0–34.0)
MCHC: 30.5 g/dL (ref 30.0–36.0)
MCHC: 31.2 g/dL (ref 30.0–36.0)
MCV: 83.7 fL (ref 80.0–100.0)
MCV: 82.5 fL (ref 80.0–100.0)
Platelets: 323 10*3/uL (ref 150–400)
Platelets: 258 10*3/uL (ref 150–400)
RBC: 5.02 MIL/uL (ref 3.87–5.11)
RBC: 4.62 MIL/uL (ref 3.87–5.11)
RDW: 12.7 % (ref 11.5–15.5)
RDW: 12.9 % (ref 11.5–15.5)
WBC: 12.8 10*3/uL — ABNORMAL HIGH (ref 4.0–10.5)
WBC: 14.9 10*3/uL — ABNORMAL HIGH (ref 4.0–10.5)
nRBC: 0 % (ref 0.0–0.2)
nRBC: 0 % (ref 0.0–0.2)

## 2020-01-02 LAB — BASIC METABOLIC PANEL
Anion gap: 8 (ref 5–15)
BUN: 6 mg/dL (ref 6–20)
CO2: 19 mmol/L — ABNORMAL LOW (ref 22–32)
Calcium: 8 mg/dL — ABNORMAL LOW (ref 8.9–10.3)
Chloride: 113 mmol/L — ABNORMAL HIGH (ref 98–111)
Creatinine, Ser: 0.61 mg/dL (ref 0.44–1.00)
GFR calc Af Amer: >60 mL/min (ref >60)
GFR calc non Af Amer: >60 mL/min (ref >60)
Glucose, Bld: 92 mg/dL (ref 70–99)
Potassium: 4.5 mmol/L (ref 3.5–5.1)
Sodium: 140 mmol/L (ref 135–145)

## 2020-01-02 LAB — PREPARE FRESH FROZEN PLASMA
Unit division: 0
Unit division: 0
Unit division: 0

## 2020-01-02 LAB — I-STAT CHEM 8, ED
BUN: 11 mg/dL (ref 6–20)
Calcium, Ion: 1.1 mmol/L — ABNORMAL LOW (ref 1.15–1.40)
Chloride: 108 mmol/L (ref 98–111)
Creatinine, Ser: 1.3 mg/dL — ABNORMAL HIGH (ref 0.44–1.00)
Glucose, Bld: 110 mg/dL — ABNORMAL HIGH (ref 70–99)
HCT: 40 % (ref 36.0–46.0)
Hemoglobin: 13.6 g/dL (ref 12.0–15.0)
Potassium: 3 mmol/L — ABNORMAL LOW (ref 3.5–5.1)
Sodium: 141 mmol/L (ref 135–145)
TCO2: 17 mmol/L — ABNORMAL LOW (ref 22–32)

## 2020-01-02 LAB — ABO/RH: ABO/RH(D): B POS

## 2020-01-02 LAB — HIV ANTIBODY (ROUTINE TESTING W REFLEX): HIV Screen 4th Generation wRfx: NONREACTIVE

## 2020-01-02 LAB — LACTIC ACID, PLASMA: Lactic Acid, Venous: 6.6 mmol/L (ref 0.5–1.9)

## 2020-01-02 LAB — PROTIME-INR
INR: 1 (ref 0.8–1.2)
Prothrombin Time: 13.1 seconds (ref 11.4–15.2)

## 2020-01-02 LAB — SARS CORONAVIRUS 2 BY RT PCR (HOSPITAL ORDER, PERFORMED IN ~~LOC~~ HOSPITAL LAB): SARS Coronavirus 2: NEGATIVE

## 2020-01-02 LAB — ETHANOL: Alcohol, Ethyl (B): 215 mg/dL — ABNORMAL HIGH (ref <10)
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
## 2020-01-02 MED ORDER — ORAL CARE MOUTH RINSE
15.0000 mL | Freq: Two times a day (BID) | OROMUCOSAL | Status: DC
  Administered 2020-01-02 – 2020-01-04 (×6): 15 mL via OROMUCOSAL

## 2020-01-02 MED ORDER — POLYETHYLENE GLYCOL 3350 17 G PO PACK
17.0000 g | PACK | Freq: Every day | ORAL | Status: DC
  Administered 2020-01-04: 17 g via ORAL
  Filled 2020-01-02 (×2): qty 1

## 2020-01-02 MED ORDER — ORAL CARE MOUTH RINSE
15.0000 mL | OROMUCOSAL | Status: DC
  Administered 2020-01-02: 15 mL via OROMUCOSAL

## 2020-01-02 MED ORDER — TETANUS-DIPHTH-ACELL PERTUSSIS 5-2.5-18.5 LF-MCG/0.5 IM SUSP
0.5000 mL | Freq: Once | INTRAMUSCULAR | Status: AC
  Administered 2020-01-02: 0.5 mL via INTRAMUSCULAR
  Filled 2020-01-02: qty 0.5

## 2020-01-02 MED ORDER — HYDROMORPHONE HCL 1 MG/ML IJ SOLN
1.0000 mg | INTRAMUSCULAR | Status: DC | PRN
  Administered 2020-01-02 – 2020-01-04 (×8): 1 mg via INTRAVENOUS
  Filled 2020-01-02 (×8): qty 1

## 2020-01-02 MED ORDER — FENTANYL CITRATE (PF) 100 MCG/2ML IJ SOLN
INTRAMUSCULAR | Status: AC
  Filled 2020-01-02: qty 2

## 2020-01-02 MED ORDER — OXYCODONE HCL 5 MG PO TABS
5.0000 mg | ORAL_TABLET | ORAL | Status: DC | PRN
  Administered 2020-01-04: 10 mg via ORAL
  Filled 2020-01-02 (×2): qty 1
  Filled 2020-01-02: qty 2

## 2020-01-02 MED ORDER — FENTANYL BOLUS VIA INFUSION
50.0000 ug | INTRAVENOUS | Status: DC | PRN
  Filled 2020-01-02: qty 50

## 2020-01-02 MED ORDER — POTASSIUM CHLORIDE IN NACL 20-0.9 MEQ/L-% IV SOLN
INTRAVENOUS | Status: DC
  Filled 2020-01-02 (×5): qty 1000

## 2020-01-02 MED ORDER — FENTANYL CITRATE (PF) 100 MCG/2ML IJ SOLN
50.0000 ug | Freq: Once | INTRAMUSCULAR | Status: AC
  Administered 2020-01-02: 50 ug via INTRAVENOUS

## 2020-01-02 MED ORDER — ENOXAPARIN SODIUM 30 MG/0.3ML ~~LOC~~ SOLN
30.0000 mg | Freq: Two times a day (BID) | SUBCUTANEOUS | Status: DC
  Administered 2020-01-02 – 2020-01-05 (×7): 30 mg via SUBCUTANEOUS
  Filled 2020-01-02 (×8): qty 0.3

## 2020-01-02 MED ORDER — CHLORHEXIDINE GLUCONATE CLOTH 2 % EX PADS
6.0000 | MEDICATED_PAD | Freq: Every day | CUTANEOUS | Status: DC
  Administered 2020-01-02: 6 via TOPICAL

## 2020-01-02 MED ORDER — PROPOFOL 1000 MG/100ML IV EMUL
0.0000 ug/kg/min | INTRAVENOUS | Status: DC
  Administered 2020-01-02 (×2): 50 ug/kg/min via INTRAVENOUS
  Administered 2020-01-02: 15 ug/kg/min via INTRAVENOUS
  Filled 2020-01-02: qty 200

## 2020-01-02 MED ORDER — CLINDAMYCIN PHOSPHATE 600 MG/50ML IV SOLN
600.0000 mg | Freq: Once | INTRAVENOUS | Status: AC
  Administered 2020-01-02: 600 mg via INTRAVENOUS
  Filled 2020-01-02: qty 50

## 2020-01-02 MED ORDER — METHOCARBAMOL 1000 MG/10ML IJ SOLN
1000.0000 mg | Freq: Three times a day (TID) | INTRAVENOUS | Status: DC | PRN
  Filled 2020-01-02: qty 10

## 2020-01-02 MED ORDER — CHLORHEXIDINE GLUCONATE 0.12% ORAL RINSE (MEDLINE KIT)
15.0000 mL | Freq: Two times a day (BID) | OROMUCOSAL | Status: DC
  Administered 2020-01-02: 15 mL via OROMUCOSAL

## 2020-01-02 MED ORDER — VECURONIUM BROMIDE 10 MG IV SOLR
INTRAVENOUS | Status: AC
  Filled 2020-01-02: qty 10

## 2020-01-02 MED ORDER — DOCUSATE SODIUM 50 MG/5ML PO LIQD
100.0000 mg | Freq: Two times a day (BID) | ORAL | Status: DC
  Administered 2020-01-02 – 2020-01-04 (×4): 100 mg via ORAL
  Filled 2020-01-02 (×8): qty 10

## 2020-01-02 MED ORDER — SODIUM CHLORIDE 0.9 % IV SOLN
INTRAVENOUS | Status: AC | PRN
  Administered 2020-01-02 (×2): 1000 mL via INTRAVENOUS

## 2020-01-02 MED ORDER — ONDANSETRON HCL 4 MG/2ML IJ SOLN
4.0000 mg | Freq: Four times a day (QID) | INTRAMUSCULAR | Status: DC | PRN
  Administered 2020-01-02 (×2): 4 mg via INTRAVENOUS
  Filled 2020-01-02 (×2): qty 2

## 2020-01-02 MED ORDER — PANTOPRAZOLE SODIUM 40 MG IV SOLR
40.0000 mg | Freq: Every day | INTRAVENOUS | Status: DC
  Administered 2020-01-02: 40 mg via INTRAVENOUS
  Filled 2020-01-02: qty 40

## 2020-01-02 MED ORDER — POTASSIUM CHLORIDE 10 MEQ/100ML IV SOLN
10.0000 meq | INTRAVENOUS | Status: AC
  Administered 2020-01-02 (×5): 10 meq via INTRAVENOUS
  Filled 2020-01-02 (×3): qty 100

## 2020-01-02 MED ORDER — ROCURONIUM BROMIDE 50 MG/5ML IV SOLN
INTRAVENOUS | Status: AC | PRN
  Administered 2020-01-02: 80 mg via INTRAVENOUS

## 2020-01-02 MED ORDER — ONDANSETRON 4 MG PO TBDP: 4.0000 mg | ORAL_TABLET | Freq: Four times a day (QID) | ORAL | Status: DC | PRN

## 2020-01-02 MED ORDER — ETOMIDATE 2 MG/ML IV SOLN
INTRAVENOUS | Status: AC | PRN
  Administered 2020-01-02: 20 mg via INTRAVENOUS

## 2020-01-02 MED ORDER — IOHEXOL 300 MG/ML  SOLN
100.0000 mL | Freq: Once | INTRAMUSCULAR | Status: AC | PRN
  Administered 2020-01-02: 100 mL via INTRAVENOUS

## 2020-01-02 MED ORDER — FENTANYL 2500MCG IN NS 250ML (10MCG/ML) PREMIX INFUSION
50.0000 ug/h | INTRAVENOUS | Status: DC
  Administered 2020-01-02: 100 ug/h via INTRAVENOUS

## 2020-01-02 MED ORDER — FENTANYL 2500MCG IN NS 250ML (10MCG/ML) PREMIX INFUSION
0.0000 ug/h | INTRAVENOUS | Status: DC
  Administered 2020-01-02: 25 ug/h via INTRAVENOUS
  Filled 2020-01-02: qty 250

## 2020-01-02 MED ORDER — PROPOFOL 1000 MG/100ML IV EMUL
INTRAVENOUS | Status: AC
  Filled 2020-01-02: qty 100

## 2020-01-02 MED ORDER — CHLORHEXIDINE GLUCONATE 0.12 % MT SOLN
15.0000 mL | Freq: Two times a day (BID) | OROMUCOSAL | Status: DC
  Administered 2020-01-02 – 2020-01-05 (×5): 15 mL via OROMUCOSAL
  Filled 2020-01-02 (×4): qty 15

## 2020-01-02 MED ORDER — PANTOPRAZOLE SODIUM 40 MG PO TBEC
40.0000 mg | DELAYED_RELEASE_TABLET | Freq: Every day | ORAL | Status: DC
  Administered 2020-01-03 – 2020-01-05 (×3): 40 mg via ORAL
  Filled 2020-01-02 (×3): qty 1
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
## 2020-01-02 NOTE — ED Notes (Signed)
Transported pt to CT with RT.

## 2020-01-02 NOTE — ED Notes (Signed)
100 mcg fent. given at CT. Noted pt unpurposeful movement of right hand. Jerking motion 10 mg vecuronium given when returning from CT d/t by generalized body shaking and pt lifting arms.

## 2020-01-02 NOTE — Progress Notes (Signed)
Transported to and from CT back to the ED w/o complications

## 2020-01-02 NOTE — Progress Notes (Signed)
1013- Contacted Dr. Sheliah Hatch regarding patient's inability to move toes or feel below bilateral knees.  Dr. Maurice Small consulted, ordered stat MRI on spine.  Took patient to MRI.  Upon return around 1330, patient was able to move toes.

## 2020-01-02 NOTE — Procedures (Signed)
Extubation Procedure Note  Patient Details:   Name: Jody Edwards DOB: September 18, 1995 MRN: [id number]   Airway Documentation:    Vent end date: 01/02/20 Vent end time: 0900   Evaluation  O2 sats: stable throughout Complications: No apparent complications Patient did tolerate procedure well. Bilateral Breath Sounds: Diminished   Yes   Pt extubated per MD order. Pt placed on 4L [place] with sats of 100%.  RN at bedside.  RT will continue to monitor.  Cain Sieve 01/02/2020, 9:06 AM
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
## 2020-01-02 NOTE — Progress Notes (Signed)
Called by nurse for new neuro finding. Patient extubated earlier but since extubation patient notes cannot feel palpation below both knees and cannot move toes -will notify neurosurgery

## 2020-01-02 NOTE — ED Notes (Signed)
18 LAC IV infiltrated during CT. IV Removed. 20G RAC IV and 20G R wrist IV placed

## 2020-01-02 NOTE — ED Notes (Signed)
Taken to CT.
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
## 2020-01-03 ENCOUNTER — Inpatient Hospital Stay (HOSPITAL_COMMUNITY): Payer: BLUE CROSS/BLUE SHIELD

## 2020-01-03 LAB — TRIGLYCERIDES: Triglycerides: 37 mg/dL (ref <150)

## 2020-01-03 IMAGING — DX DG CHEST 1V PORT
1 series · 1 of 1 positions shown · non-contrast
Comparison: [DATE]

CLINICAL DATA: Bilateral pneumothorax.

EXAM:
PORTABLE CHEST 1 VIEW

[chest ap]
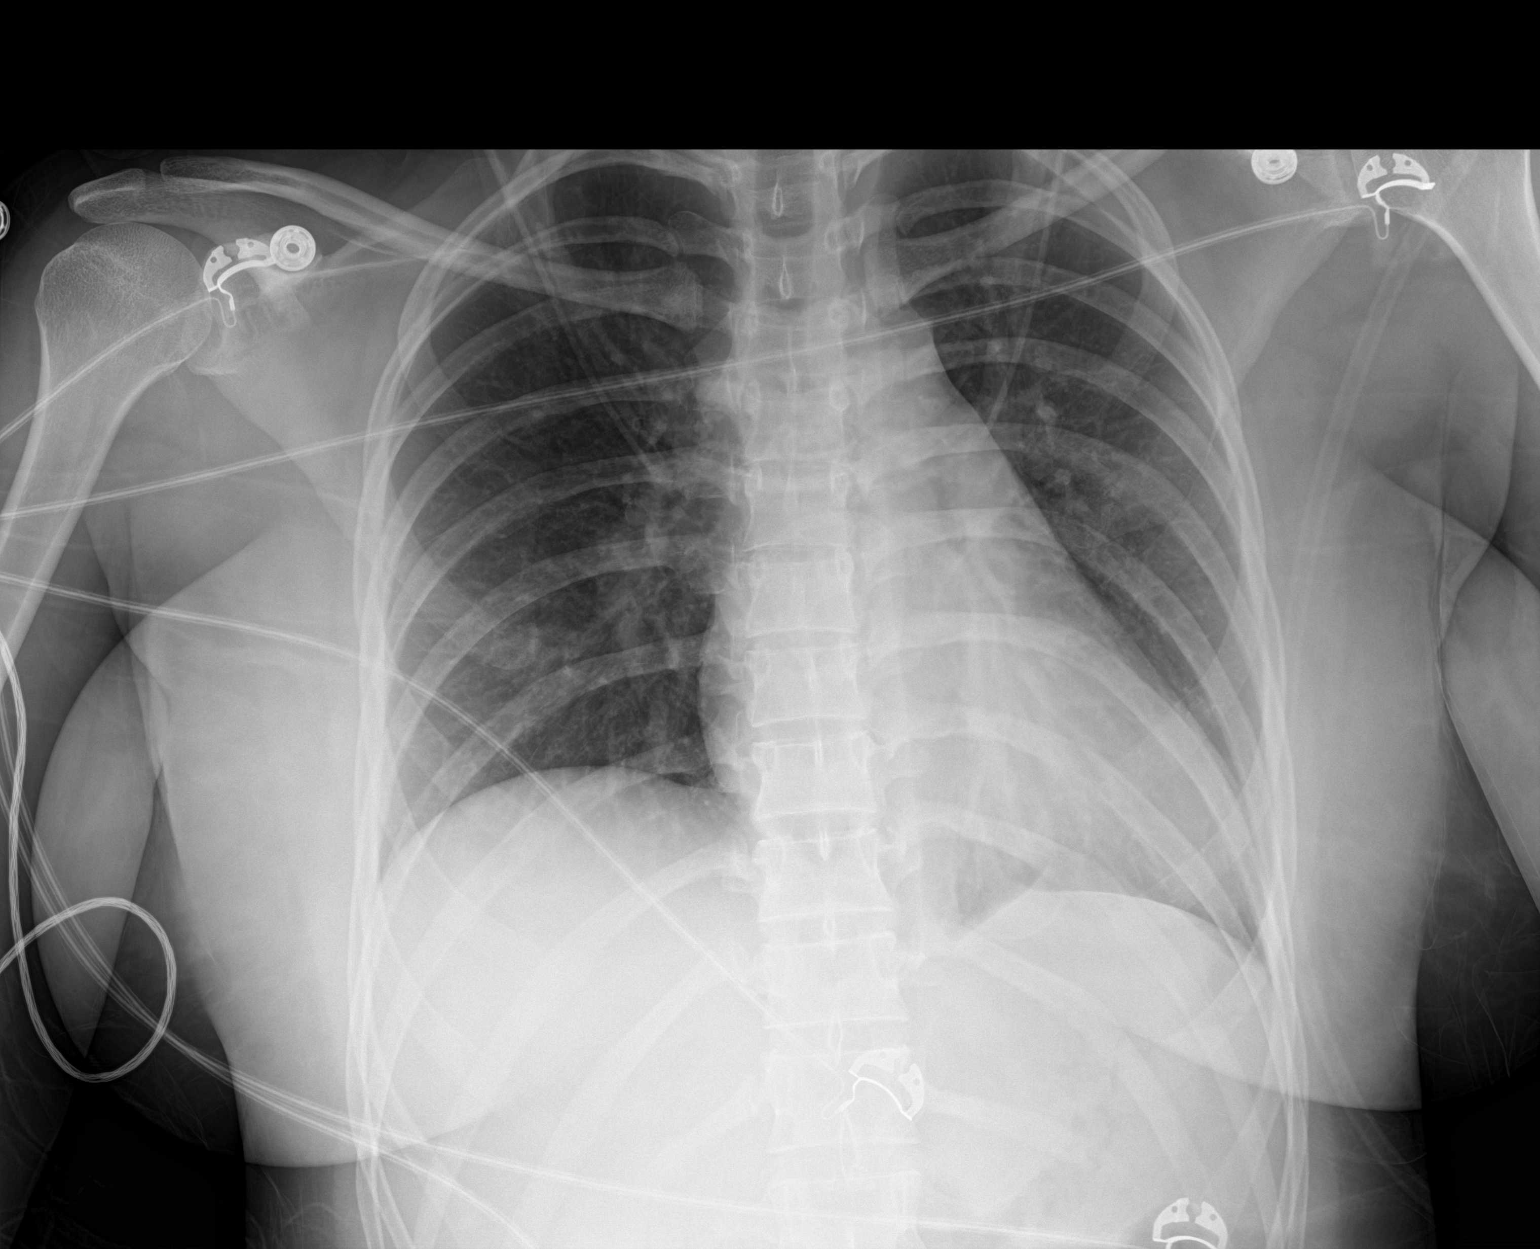

[1 of 1 positions shown; findings below may reference images not displayed]

FINDINGS: Lungs are adequately inflated and otherwise clear. No pneumothorax.
Cardiomediastinal silhouette is normal. Bones and soft tissues
unchanged.
IMPRESSION: No acute findings.

## 2020-01-03 MED ORDER — METHOCARBAMOL 500 MG PO TABS
750.0000 mg | ORAL_TABLET | Freq: Three times a day (TID) | ORAL | Status: DC
  Administered 2020-01-03 – 2020-01-05 (×7): 750 mg via ORAL
  Filled 2020-01-03 (×3): qty 2
  Filled 2020-01-03 (×2): qty 1.5
  Filled 2020-01-03 (×2): qty 2

## 2020-01-03 MED ORDER — ACETAMINOPHEN 325 MG PO TABS
650.0000 mg | ORAL_TABLET | Freq: Four times a day (QID) | ORAL | Status: DC
  Administered 2020-01-03 – 2020-01-05 (×7): 650 mg via ORAL
  Filled 2020-01-03 (×8): qty 2

## 2020-01-03 MED ORDER — IOHEXOL 300 MG/ML  SOLN: 100.0000 mL | Freq: Once | INTRAMUSCULAR | Status: DC | PRN

## 2020-01-03 NOTE — Progress Notes (Signed)
Received pt from 4N via hospital bed accompanied by staff  And family. Pt alert/oriented in no apparent distress. No complaints at this time.. Pt situated/orientated to room/equipments. Patient guide/menu provided to pt/family. Pt/family have been informed that facility is not responsible for any losses/damages to personal belongings/valuables.3 side rails up and call bell/room phone within reach. Bed in lowest position and all wheels locked.

## 2020-01-03 NOTE — Plan of Care (Signed)
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
## 2020-01-04 ENCOUNTER — Encounter (HOSPITAL_COMMUNITY): Payer: Self-pay | Admitting: General Surgery

## 2020-01-04 LAB — CBC
HCT: 31.3 % — ABNORMAL LOW (ref 36.0–46.0)
Hemoglobin: 9.8 g/dL — ABNORMAL LOW (ref 12.0–15.0)
MCH: 26.1 pg (ref 26.0–34.0)
MCHC: 31.3 g/dL (ref 30.0–36.0)
MCV: 83.2 fL (ref 80.0–100.0)
Platelets: 210 10*3/uL (ref 150–400)
RBC: 3.76 MIL/uL — ABNORMAL LOW (ref 3.87–5.11)
RDW: 12.9 % (ref 11.5–15.5)
WBC: 8.6 10*3/uL (ref 4.0–10.5)
nRBC: 0 % (ref 0.0–0.2)

## 2020-01-04 LAB — TYPE AND SCREEN
ABO/RH(D): B POS
Antibody Screen: NEGATIVE
Unit division: 0
Unit division: 0
Unit division: 0
Unit division: 0

## 2020-01-04 LAB — BASIC METABOLIC PANEL
Anion gap: 6 (ref 5–15)
BUN: <5 mg/dL — ABNORMAL LOW (ref 6–20)
CO2: 20 mmol/L — ABNORMAL LOW (ref 22–32)
Calcium: 7.7 mg/dL — ABNORMAL LOW (ref 8.9–10.3)
Chloride: 111 mmol/L (ref 98–111)
Creatinine, Ser: 0.49 mg/dL (ref 0.44–1.00)
GFR calc Af Amer: >60 mL/min (ref >60)
GFR calc non Af Amer: >60 mL/min (ref >60)
Glucose, Bld: 78 mg/dL (ref 70–99)
Potassium: 5.3 mmol/L — ABNORMAL HIGH (ref 3.5–5.1)
Sodium: 137 mmol/L (ref 135–145)

## 2020-01-04 LAB — BPAM RBC
Blood Product Expiration Date: 202107222359
Blood Product Expiration Date: 202107222359
Blood Product Expiration Date: 202108032359
Blood Product Expiration Date: 202108032359
ISSUE DATE / TIME: 202107030306
ISSUE DATE / TIME: 202107030306
ISSUE DATE / TIME: 202107030321
ISSUE DATE / TIME: 202107030321
Unit Type and Rh: 9500
Unit Type and Rh: 9500
Unit Type and Rh: 9500
Unit Type and Rh: 9500

## 2020-01-04 LAB — TRIGLYCERIDES: Triglycerides: 59 mg/dL (ref <150)

## 2020-01-04 MED ORDER — HYDROMORPHONE HCL 1 MG/ML IJ SOLN: 1.0000 mg | Freq: Four times a day (QID) | INTRAMUSCULAR | Status: DC | PRN

## 2020-01-04 MED ORDER — MELATONIN 3 MG PO TABS
3.0000 mg | ORAL_TABLET | Freq: Every day | ORAL | Status: DC
  Administered 2020-01-04: 3 mg via ORAL
  Filled 2020-01-04: qty 1

## 2020-01-04 MED ORDER — ENSURE ENLIVE PO LIQD
237.0000 mL | Freq: Three times a day (TID) | ORAL | Status: DC
  Administered 2020-01-04 – 2020-01-05 (×3): 237 mL via ORAL

## 2020-01-04 MED ORDER — SODIUM CHLORIDE 0.9 % IV SOLN: INTRAVENOUS | Status: DC

## 2020-01-04 MED ORDER — IBUPROFEN 200 MG PO TABS
400.0000 mg | ORAL_TABLET | Freq: Four times a day (QID) | ORAL | Status: DC
  Administered 2020-01-04 – 2020-01-05 (×4): 400 mg via ORAL
  Filled 2020-01-04 (×4): qty 2
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
## 2020-01-04 NOTE — Plan of Care (Signed)
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
## 2020-01-05 ENCOUNTER — Encounter (HOSPITAL_COMMUNITY): Payer: Self-pay | Admitting: General Surgery

## 2020-01-05 ENCOUNTER — Other Ambulatory Visit: Payer: Self-pay

## 2020-01-05 LAB — BASIC METABOLIC PANEL
Anion gap: 10 (ref 5–15)
BUN: 5 mg/dL — ABNORMAL LOW (ref 6–20)
CO2: 22 mmol/L (ref 22–32)
Calcium: 8.6 mg/dL — ABNORMAL LOW (ref 8.9–10.3)
Chloride: 105 mmol/L (ref 98–111)
Creatinine, Ser: 0.52 mg/dL (ref 0.44–1.00)
GFR calc Af Amer: >60 mL/min (ref >60)
GFR calc non Af Amer: >60 mL/min (ref >60)
Glucose, Bld: 85 mg/dL (ref 70–99)
Potassium: 3.6 mmol/L (ref 3.5–5.1)
Sodium: 137 mmol/L (ref 135–145)

## 2020-01-05 LAB — TRIGLYCERIDES: Triglycerides: 57 mg/dL (ref <150)

## 2020-01-05 LAB — BLOOD PRODUCT ORDER (VERBAL) VERIFICATION

## 2020-01-05 MED ORDER — POLYETHYLENE GLYCOL 3350 17 G PO PACK: 17.0000 g | PACK | Freq: Every day | ORAL | 0 refills | Status: AC

## 2020-01-05 MED ORDER — METHOCARBAMOL 750 MG PO TABS: 750.0000 mg | ORAL_TABLET | Freq: Three times a day (TID) | ORAL | 0 refills | Status: AC

## 2020-01-05 MED ORDER — OXYCODONE HCL 10 MG PO TABS: 5.0000 mg | ORAL_TABLET | Freq: Four times a day (QID) | ORAL | 0 refills | Status: AC | PRN

## 2020-01-05 MED ORDER — IBUPROFEN 400 MG PO TABS: 400.0000 mg | ORAL_TABLET | Freq: Four times a day (QID) | ORAL | 0 refills | Status: AC

## 2020-01-05 MED ORDER — ACETAMINOPHEN 325 MG PO TABS: 650.0000 mg | ORAL_TABLET | Freq: Four times a day (QID) | ORAL | Status: AC

## 2020-01-05 MED ORDER — OXYCODONE HCL 5 MG PO TABS
5.0000 mg | ORAL_TABLET | ORAL | Status: DC | PRN
  Administered 2020-01-05: 10 mg via ORAL
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
## 2020-01-05 NOTE — Progress Notes (Signed)
Pt was ambulating to the hall today. C-collar on at all times. Pain is controlled with oral narcotics. Discharge instructions given to pt and mother, verbalized understanding. Discharged to home accompanied by mother.
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
## 2020-01-05 NOTE — Discharge Instructions (Signed)
Cervical Spine Fracture, Stable  A cervical spine fracture is a break or crack in one of the bones of the neck. If there is a very low risk of problems happening during healing, the fracture is considered stable. What are the causes? This condition may be caused by:  Motor vehicle accidents.  Injuries from sports such as diving, football, biking, wrestling, or skiing.  Severe osteoporosis or other bone diseases, such as cancers that spread to bone or metabolic abnormalities that cause bone weakness. What are the signs or symptoms? Symptoms of this condition include:  Severe neck pain after an accident or fall. Pain may spread down the shoulders or arms.  Bruising or swelling on the back of the neck.  Numbness, tingling, sudden muscle tightening (spasms), or weakness in the arms, legs, or both. How is this diagnosed? This condition may be diagnosed based on:  Your medical history.  A physical exam of your neck, arms, and legs.  Imaging studies of the neck, such as: ? X-rays. ? CT scan. ? MRI. How is this treated? This condition is treated with a neck brace or cervical collar to keep your neck from moving during the healing process. A cervical collar is a device that supports your chin and the back of your head. You may also be given medicine to help relieve pain. Follow these instructions at home: If you have a neck brace:  Wear the brace as told by your health care provider. Remove it only as told by your health care provider.  If you experience numbness or tingling, loosen the brace.  Keep the brace clean.  If the brace is not waterproof: ? Do not let it get wet. ? Cover it with a watertight covering when you take a bath or a shower. If you have a cervical collar:  Do not remove the collar unless your health care provider tells you to do this. If you are allowed to remove the collar for cleaning and bathing: ? Follow your health care providers instructions about how  to safely take off the collar. ? Wash and thoroughly dry the skin on your neck. Check your skin for irritation or sores. If you see any, tell your health care provider.  Ask your health care provider before making any adjustments to your collar. Small adjustments may be needed over time to improve comfort and reduce pressure on your chin or on the back of your head.  Keep long hair outside of the collar.  Keep your collar clean by wiping it with mild soap and water and letting it air-dry completely. The pads can be hand-washed with soap and water and air-dried completely. Managing pain, stiffness, and swelling   If directed, put ice on the injured area: ? If you have a removable brace or cervical collar, remove it as told by your health care provider. ? Put ice in a plastic bag. ? Place a towel between your skin and the bag. ? Leave the ice on for 20 minutes, 2-3 times a day. Activity  Do not drive a car until your health care provider approves.  Do not drive or use heavy machinery while taking prescription pain medicine.  Avoid physical activity for as long as directed. Ask your health care provider what activities are safe for you. General instructions  Take over-the-counter and prescription medicines only as told by your health care provider.  Do not take baths, swim, or use a hot tub until your health care provider approves. Ask your  health care provider if you can take showers. You may only be allowed to take sponge baths for bathing.  Do not use any products that contain nicotine or tobacco, such as cigarettes and e-cigarettes. These can delay bone healing. If you need help quitting, ask your health care provider.  Keep all follow-up visits as told by your health care provider. This is important to help prevent long-term (chronic) or permanent injury, pain, and disability. You may need to have follow-up X-rays or MRI 1-3 weeks after your injury. Contact a health care provider  if:  You have irritation or sores on your skin from your brace or cervical collar. Get help right away if:  You have neck pain that gets worse.  You develop difficulties swallowing or breathing.  You develop swelling in your neck.  You have any of the following problems in your arms, legs, or both: ? Numbness. ? Weakness. ? Burning pain. ? Movement problems.  You are unable to control when you urinate or have a bowel movement (incontinence).  You have problems with coordination or difficulty walking. This information is not intended to replace advice given to you by your health care provider. Make sure you discuss any questions you have with your health care provider. Document Revised: 05/31/2017 Document Reviewed: 03/22/2016 Elsevier Patient Education  2020 [employer]   Tooth Injuries Tooth injuries (tooth trauma) are injuries that include:  Cracked or broken teeth (fractures).  Teeth that have been moved out of place or dislodged (luxations).  Knocked-out teeth (avulsions). A tooth injury often needs to be treated quickly to save the tooth. If it is not possible to save a tooth after an injury, the tooth may need to be taken out (extracted). Tooth injuries may be caused by any force that is strong enough to chip, break, dislodge, or knock out a tooth. Follow these instructions at home: Medicines  Take over-the-counter and prescription medicines only as told by your doctor.  If you were given an antibiotic medicine, use it as told by your doctor. Do not stop taking the medicine even if you start to feel better.  Do not drive or use heavy machinery while taking prescription pain medicine. Managing pain, stiffness, and swelling  If told, apply ice to your mouth near the injured tooth: ? Put ice in a plastic bag. ? Place a towel between your skin and the bag. ? Leave the ice on for 20 minutes, 2-3 times a day.  Gargle with a salt-water mixture 3-4 times a day. To  make this, dissolve -1 tsp of salt in 1 cup of warm water.  Check the injured area every day for signs of infection. Watch for: ? Redness, swelling, or pain. ? Fluid, blood, or pus. General instructions   Do not eat or chew on very hard objects. These include ice cubes, pens, pencils, hard candy, and popcorn.  Do not use your teeth to open packages.  Do not use any products that contain nicotine or tobacco, such as cigarettes and e-cigarettes. These may delay healing. If you need help quitting, ask your doctor.  Do not clench or grind your teeth. Tell your doctor if you grind your teeth while you sleep.  Eat only soft foods as told by your doctor.  Brush your teeth gently as told by your doctor.  Always wear mouth guard when you play contact sports.  Keep all follow-up visits as told by your doctor. This is important. Contact a doctor if you:  Continue  to have tooth pain after taking medicine.  Have pus near the injured tooth.  Develop swelling near your injured tooth.  Have a tooth splint and it becomes loose.  Have a lose tooth. Get help right away if:  Your face swells.  You have a fever.  You have bleeding near the tooth that does not stop after 10 minutes.  You have trouble swallowing.  You are not able to open your mouth.  Your tooth comes out. Summary  Tooth injuries are injuries that are strong enough to cause a tooth to chip, break, dislodge, or come out.  Treatment may need to be done quickly to save your tooth.  Your doctor will tell you how to care for your injured tooth. He or she will tell you how to take medicines and how to check for infection.  Call your doctor if there is bleeding or swelling near the injured tooth, or if you have a fever. This information is not intended to replace advice given to you by your health care provider. Make sure you discuss any questions you have with your health care provider. Document Revised: 07/01/2017  Document Reviewed: 07/01/2017 Else[employer] Patient Education  2020 [employer]   Rib Fracture  A rib fracture is a break or crack in one of the bones of the ribs. The ribs are like a cage that goes around your upper chest. A broken or cracked rib is often painful, but most do not cause other problems. Most rib fractures usually heal on their own in 1-3 months. Follow these instructions at home: Managing pain, stiffness, and swelling  If directed, apply ice to the injured area. ? Put ice in a plastic bag. ? Place a towel between your skin and the bag. ? Leave the ice on for 20 minutes, 2-3 times a day.  Take over-the-counter and prescription medicines only as told by your doctor. Activity  Avoid activities that cause pain to the injured area. Protect your injured area.  Slowly increase activity as told by your doctor. General instructions  Do deep breathing as told by your doctor. You may be told to: ? Take deep breaths many times a day. ? Cough many times a day while hugging a pillow. ? Use a device (incentive spirometer) to do deep breathing many times a day.  Drink enough fluid to keep your pee (urine) clear or pale yellow.  Do not wear a rib belt or binder. These do not allow you to breathe deeply.  Keep all follow-up visits as told by your doctor. This is important. Contact a doctor if:  You have a fever. Get help right away if:  You have trouble breathing.  You are short of breath.  You cannot stop coughing.  You cough up thick or bloody spit (sputum).  You feel sick to your stomach (nauseous), throw up (vomit), or have belly (abdominal) pain.  Your pain gets worse and medicine does not help. Summary  A rib fracture is a break or crack in one of the bones of the ribs.  Apply ice to the injured area and take medicines for pain as told by your doctor.  Take deep breaths and cough many times a day. Hug a pillow every time you cough. This information is not  intended to replace advice given to you by your health care provider. Make sure you discuss any questions you have with your health care provider. Document Revised: 05/31/2017 Document Reviewed: 09/18/2016 Else[employer] Patient Education  2020 [employer]   Nasal Fracture A fracture is a break in a bone. A nasal fracture is a broken nose. Minor breaks do not need treatment. They often heal on their own in about one month. Serious breaks may need treatment. Sometimes surgery is needed. What are the causes? This condition is usually caused by a direct hit to the nose (blunt injury). This often occurs from:  Playing a contact sport.  Being in a car accident.  Falling.  Getting punched. What are the signs or symptoms?  Pain.  Swelling of the nose.  Bleeding from the nose.  Bruising around the nose or bruising around the eyes (black eyes).  The nose having a crooked shape. How is this treated? Treatment depends on how bad the injury is.  Minor breaks often do not need treatment.  For more serious breaks that have caused bones to move out of position, treatment may involve one of these: ? Moving the bones back into position without surgery. Your doctor may be able to do this in his or her office after you are given medicine to numb the nose area (local anesthetic). ? Surgery. If needed, this will be done after the swelling is gone. Follow these instructions at home: Activity  Return to your normal activities as told by your doctor. Ask your doctor what activities are safe for you.  Do not play contact sports for 3-4 weeks or as told by your doctor. General instructions      If told, put ice on the injured area: ? Put ice in a plastic bag. ? Place a towel between your skin and the bag. ? Leave the ice on for 20 minutes, 2-3 times a day.  Take over-the-counter and prescription medicines only as told by your doctor.  If your nose bleeds, sit up while you gently squeeze your  nose shut for 10 minutes.  Try to not blow your nose.  Keep all follow-up visits as told by your doctor. This is important. Contact a doctor if:  You have more pain or very bad pain.  You keep having nosebleeds.  The shape of your nose does not return to normal after 5 days.  You have pus coming out of your nose. Get help right away if:  Your nose bleeds for more than 20 minutes.  You have clear fluid draining out of your nose.  You have a swelling on the inside of your nose that does not get better.  You have trouble moving your eyes.  You keep throwing up (vomiting). Summary  A nasal fracture is a broken nose.  Symptoms include pain, swelling, and bruising.  Minor breaks often do not require treatment. More serious breaks may require surgery or other treatments.  If your nose bleeds, sit up while you gently squeeze your nose shut for 10 minutes. This information is not intended to replace advice given to you by your health care provider. Make sure you discuss any questions you have with your health care provider. Document Revised: 11/19/2017 Document Reviewed: 11/19/2017 Elsevier Patient Education  2020 [employer].

## 2020-01-06 ENCOUNTER — Encounter (HOSPITAL_COMMUNITY): Payer: Self-pay | Admitting: *Deleted

## 2020-01-15 DIAGNOSIS — S129XXA Fracture of neck, unspecified, initial encounter: Secondary | ICD-10-CM | POA: Diagnosis not present

## 2020-01-19 DIAGNOSIS — G4700 Insomnia, unspecified: Secondary | ICD-10-CM | POA: Diagnosis not present

## 2020-01-19 DIAGNOSIS — N946 Dysmenorrhea, unspecified: Secondary | ICD-10-CM | POA: Diagnosis not present

## 2020-01-19 DIAGNOSIS — S129XXA Fracture of neck, unspecified, initial encounter: Secondary | ICD-10-CM | POA: Diagnosis not present

## 2020-01-19 DIAGNOSIS — F418 Other specified anxiety disorders: Secondary | ICD-10-CM | POA: Diagnosis not present

## 2020-01-31 DIAGNOSIS — 419620001 Death: Secondary | SNOMED CT | POA: Diagnosis not present

## 2020-01-31 DEATH — deceased

## 2020-02-11 DIAGNOSIS — S129XXA Fracture of neck, unspecified, initial encounter: Secondary | ICD-10-CM | POA: Diagnosis not present

## 2020-04-14 DIAGNOSIS — M546 Pain in thoracic spine: Secondary | ICD-10-CM | POA: Diagnosis not present

## 2020-04-14 DIAGNOSIS — G8929 Other chronic pain: Secondary | ICD-10-CM | POA: Diagnosis not present

## 2020-04-26 DIAGNOSIS — G8929 Other chronic pain: Secondary | ICD-10-CM | POA: Diagnosis not present

## 2020-04-26 DIAGNOSIS — M6281 Muscle weakness (generalized): Secondary | ICD-10-CM | POA: Diagnosis not present

## 2020-04-26 DIAGNOSIS — M546 Pain in thoracic spine: Secondary | ICD-10-CM | POA: Diagnosis not present

## 2020-04-26 DIAGNOSIS — R2689 Other abnormalities of gait and mobility: Secondary | ICD-10-CM | POA: Diagnosis not present

## 2020-05-11 DIAGNOSIS — M6281 Muscle weakness (generalized): Secondary | ICD-10-CM | POA: Diagnosis not present

## 2020-05-11 DIAGNOSIS — G8929 Other chronic pain: Secondary | ICD-10-CM | POA: Diagnosis not present

## 2020-05-11 DIAGNOSIS — M546 Pain in thoracic spine: Secondary | ICD-10-CM | POA: Diagnosis not present

## 2020-05-11 DIAGNOSIS — R2689 Other abnormalities of gait and mobility: Secondary | ICD-10-CM | POA: Diagnosis not present

## 2020-05-24 DIAGNOSIS — M546 Pain in thoracic spine: Secondary | ICD-10-CM | POA: Diagnosis not present

## 2020-05-24 DIAGNOSIS — R2689 Other abnormalities of gait and mobility: Secondary | ICD-10-CM | POA: Diagnosis not present

## 2020-05-24 DIAGNOSIS — M6281 Muscle weakness (generalized): Secondary | ICD-10-CM | POA: Diagnosis not present

## 2020-05-24 DIAGNOSIS — G8929 Other chronic pain: Secondary | ICD-10-CM | POA: Diagnosis not present

## 2020-06-02 DIAGNOSIS — M6281 Muscle weakness (generalized): Secondary | ICD-10-CM | POA: Diagnosis not present

## 2020-06-02 DIAGNOSIS — R2689 Other abnormalities of gait and mobility: Secondary | ICD-10-CM | POA: Diagnosis not present

## 2020-06-02 DIAGNOSIS — G8929 Other chronic pain: Secondary | ICD-10-CM | POA: Diagnosis not present

## 2020-06-02 DIAGNOSIS — M546 Pain in thoracic spine: Secondary | ICD-10-CM | POA: Diagnosis not present

## 2020-06-17 DIAGNOSIS — M6281 Muscle weakness (generalized): Secondary | ICD-10-CM | POA: Diagnosis not present

## 2020-06-17 DIAGNOSIS — R2689 Other abnormalities of gait and mobility: Secondary | ICD-10-CM | POA: Diagnosis not present

## 2020-06-17 DIAGNOSIS — M546 Pain in thoracic spine: Secondary | ICD-10-CM | POA: Diagnosis not present

## 2020-06-17 DIAGNOSIS — G8929 Other chronic pain: Secondary | ICD-10-CM | POA: Diagnosis not present
